# Patient Record
Sex: Male | Born: 1980 | Race: Black or African American | Hispanic: No | Marital: Single | Smoking: Never smoker
Health system: Southern US, Community
[De-identification: ages and names within clinical notes are randomized; demographics above are authoritative.]

## PROBLEM LIST (undated history)

## (undated) DIAGNOSIS — M5430 Sciatica, unspecified side: Secondary | ICD-10-CM

## (undated) DIAGNOSIS — F32A Depression, unspecified: Secondary | ICD-10-CM

## (undated) DIAGNOSIS — F329 Major depressive disorder, single episode, unspecified: Secondary | ICD-10-CM

## (undated) DIAGNOSIS — F419 Anxiety disorder, unspecified: Secondary | ICD-10-CM

## (undated) DIAGNOSIS — I10 Essential (primary) hypertension: Secondary | ICD-10-CM

## (undated) DIAGNOSIS — E119 Type 2 diabetes mellitus without complications: Secondary | ICD-10-CM

## (undated) HISTORY — PX: NO PAST SURGERIES: SHX2092

---

## 2015-02-05 ENCOUNTER — Encounter (HOSPITAL_COMMUNITY): Payer: Self-pay | Admitting: *Deleted

## 2015-02-05 ENCOUNTER — Emergency Department (HOSPITAL_COMMUNITY): Payer: Self-pay

## 2015-02-05 ENCOUNTER — Emergency Department (HOSPITAL_COMMUNITY)
Admission: EM | Admit: 2015-02-05 | Discharge: 2015-02-05 | Disposition: A | Discharge status: Home / Self Care | Payer: Self-pay | Attending: Emergency Medicine | Admitting: Emergency Medicine

## 2015-02-05 DIAGNOSIS — M545 Low back pain: Secondary | ICD-10-CM | POA: Insufficient documentation

## 2015-02-05 DIAGNOSIS — G8929 Other chronic pain: Secondary | ICD-10-CM | POA: Insufficient documentation

## 2015-02-05 DIAGNOSIS — R1110 Vomiting, unspecified: Secondary | ICD-10-CM | POA: Insufficient documentation

## 2015-02-05 MED ORDER — DIAZEPAM 5 MG PO TABS
5.0000 mg | ORAL_TABLET | Freq: Once | ORAL | Status: AC
Start: 2015-02-05 — End: 2015-02-05
  Administered 2015-02-05: 5 mg via ORAL
  Filled 2015-02-05: qty 1

## 2015-02-05 MED ORDER — DIAZEPAM 5 MG PO TABS: 5.0000 mg | ORAL_TABLET | Freq: Two times a day (BID) | ORAL | Status: DC

## 2015-02-05 MED ORDER — OXYCODONE-ACETAMINOPHEN 5-325 MG PO TABS: 2.0000 | ORAL_TABLET | ORAL | Status: DC | PRN

## 2015-02-05 MED ORDER — OXYCODONE-ACETAMINOPHEN 5-325 MG PO TABS
1.0000 | ORAL_TABLET | Freq: Once | ORAL | Status: AC
  Administered 2015-02-05: 1 via ORAL
  Filled 2015-02-05: qty 1

## 2015-02-05 NOTE — ED Provider Notes (Signed)
CSN: [id number]     Arrival date & time 02/05/15  1543 History  By signing my name below, I, Gwenyth Oberatherine Macek, attest that this documentation has been prepared under the direction and in the presence of Melburn HakeNicole Ambrea Hegler, [place]-C.  Electronically Signed: Gwenyth Oberatherine Macek, ED Scribe. 02/05/2015. 5:28 PM.   Chief Complaint  Patient presents with  . Back Pain   The history is provided by the patient. No language interpreter was used.    HPI Comments: Andres Collins is a [age] y.o. male with a history of chronic back pain who presents to the Emergency Department complaining of an acute-on-chronic episode of constant, moderate, cramping lower back pain that radiates to his right hip and right leg and started 4 days ago. Pt states 1 episode of vomiting secondary to pain. He tried Vicodin with no relief. Pt has been previously diagnosed with a herniated disc. He had an MRI prior to diagnosis, but is not currently followed by any specialists. Pt is required to do a lot of heavy lifting during work, but denies new injuries. He denies a history of cancer, spinal manipulation and IV drug use. Pt also denies saddle paresthesias, bladder or bowel incontinence, hematuria and difficulty urinating.  History reviewed. No pertinent past medical history. History reviewed. No pertinent past surgical history. No family history on file. Social History  Substance Use Topics  . Smoking status: Never Smoker   . Smokeless tobacco: None  . Alcohol Use: Yes    Review of Systems  Genitourinary: Negative for hematuria and difficulty urinating.  Musculoskeletal: Positive for back pain.  Neurological: Negative for numbness.      Allergies  Review of patient's allergies indicates not on file.  Home Medications   Prior to Admission medications   Medication Sig Start Date End Date Taking? Authorizing Provider  diazepam (VALIUM) 5 MG tablet Take 1 tablet (5 mg total) by mouth 2 (two) times daily. 02/05/15   Barrett HenleNicole Elizabeth  Amadu Schlageter, PA-C  oxyCODONE-acetaminophen (PERCOCET/ROXICET) 5-325 MG tablet Take 2 tablets by mouth every 4 (four) hours as needed for severe pain. 02/05/15   Satira SarkNicole Elizabeth Suly Vukelich, PA-C   BP 132/98 mmHg  Pulse 102  Temp(Src) 98.3 F (36.8 C) (Oral)  Resp 18  SpO2 100% Physical Exam  Constitutional: He is oriented to person, place, and time. He appears well-developed and well-nourished. No distress.  HENT:  Head: Normocephalic and atraumatic.  Eyes: Conjunctivae and EOM are normal.  Neck: Neck supple. No tracheal deviation present.  Cardiovascular: Normal rate.   Pulmonary/Chest: Effort normal. No respiratory distress.  Musculoskeletal:  Midline lumbar and right paraspinal muscles TTP  No cervical or thoracic midline tenderness Decreased ROM of back due to pain 5/5 strength bilateral LE Sensation intact 2+ PT pulses Positive right straight leg raise Pt able to stand and ambulate with reported pain  Neurological: He is alert and oriented to person, place, and time. He has normal reflexes. Coordination normal.  Skin: Skin is warm and dry.  Psychiatric: He has a normal mood and affect. His behavior is normal.  Nursing note and vitals reviewed.   ED Course  Procedures  DIAGNOSTIC STUDIES: Oxygen Saturation is 100% on RA, normal by my interpretation.    COORDINATION OF CARE: 5:29 PM Discussed treatment plan with pt which includes an x-ray of his lumbar spine, pain management and a muscle relaxant. Pt agreed to plan.  Labs Review Labs Reviewed - No data to display  Imaging Review Dg Lumbar Spine Complete  02/05/2015  CLINICAL DATA:  Acute on chronic episode of constant, moderate, cramping low back pain radiating to the right hip and right leg. Pain started 4 days ago. Vomiting due to pain. Previous history of herniated disc. EXAM: LUMBAR SPINE - COMPLETE 4+ VIEW COMPARISON:  None. FINDINGS: Normal alignment of the lumbar spine. No vertebral compression deformities. Degenerative  changes at L5-S1 with narrowed disc space and endplate hypertrophic changes. No focal bone lesion or bone destruction. Normal alignment of the facet joints. IMPRESSION: Degenerative changes most prominent at L5-S1. No acute displaced fractures identified. Electronically Signed   By: Burman Nieves M.D.   On: 02/05/2015 18:35   I have personally reviewed and evaluated these images as part of my medical decision-making.  Filed Vitals:   02/05/15 1614  BP: 132/98  Pulse: 102  Temp: 98.3 F (36.8 C)  Resp: 18     MDM   Final diagnoses:  Low back pain, unspecified back pain laterality, with sciatica presence unspecified    Patient presents with lower back pain radiating down to right hip and buttocks. He reports pain is consistent with back pain he has had in the past. Denies any recent fall, trauma, injury but endorses doing heavy lifting at work. No back pain red flags. VSS. Exam revealed lumbar midline and right paraspinal muscles TTP, positive right straight leg raise, decreased range of motion of back due to pain, bilateral lower extremities otherwise neurovascularly intact, patient able to stand and ambulate. Patient given pain meds and muscle relaxant, reports mild relief. Lumbar spine x-ray revealed degenerative changes, no acute abnormalities. I suspect patient's pain is likely associated with chronic back pain associated with herniated disc. I do not suspect cauda equina/spinal cord compression at this time. Discussed results and plan for discharge with patient. Patient given resource guide for follow-up.  Evaluation does not show pathology requring ongoing emergent intervention or admission. Pt is hemodynamically stable and mentating appropriately. Discussed findings/results and plan with patient/guardian, who agrees with plan. All questions answered. Return precautions discussed and outpatient follow up given.   I personally performed the services described in this documentation,  which was scribed in my presence. The recorded information has been reviewed and is accurate.    Satira Sark [place], [place] 02/05/15 1859  Nelva Nay, MD 02/06/15 1257

## 2015-02-05 NOTE — Discharge Instructions (Signed)
Take your medications as prescribed as needed for pain relief. He may also apply ice to affected area for 15-20 minutes 3-4 times daily. Please follow up with a primary care provider from the Resource Guide provided below in 1 week. Please return to the Emergency Department if symptoms worsen or new onset of fever, numbness, tingling, groin anesthesia, loss of bowel or bladder, weakness.    Emergency Department Resource Guide 1) Find a Doctor and Pay Out of Pocket Although you won't have to find out who is covered by your insurance plan, it is a good idea to ask around and get recommendations. You will then need to call the office and see if the doctor you have chosen will accept you as a new patient and what types of options they offer for patients who are self-pay. Some doctors offer discounts or will set up payment plans for their patients who do not have insurance, but you will need to ask so you aren't surprised when you get to your appointment.  2) Contact Your Local Health Department Not all health departments have doctors that can see patients for sick visits, but many do, so it is worth a call to see if yours does. If you don't know where your local health department is, you can check in your phone book. The CDC also has a tool to help you locate your state's health department, and many state websites also have listings of all of their local health departments.  3) Find a Walk-in Clinic If your illness is not likely to be very severe or complicated, you may want to try a walk in clinic. These are popping up all over the country in pharmacies, drugstores, and shopping centers. They're usually staffed by nurse practitioners or physician assistants that have been trained to treat common illnesses and complaints. They're usually fairly quick and inexpensive. However, if you have serious medical issues or chronic medical problems, these are probably not your best option.  No Primary Care  Doctor: - Call Health Connect at  [phone] - they can help you locate a primary care doctor that  accepts your insurance, provides certain services, etc. - Physician Referral Service- [phone]  Chronic Pain Problems: Organization         Address  Phone   Notes  [facility]  [phone] Patients need to be referred by their primary care doctor.   Medication Assistance: Organization         Address  Phone   Notes  [facility] Medication [facility] [address] [place], [place] [address] [phone] --Must be a resident of [place] -- Must have NO insurance coverage whatsoever (no Medicaid/ Medicare, etc.) -- The pt. MUST have a primary care doctor that directs their care regularly and follows them in the community   MedAssist  [phone]   [employer]  [phone]) [phone]    Agencies that provide inexpensive medical care: Organization         Address  Phone   Notes  [facility]  [phone]   [facility]    [phone]) [phone]   [facility] [address] [place], [place] [address] [phone]   [facility] [address] [place], [place] [phone]) [phone]   Planned Parenthood    [phone]   [facility]    [phone](336) [phone]   [facility] and [facility]  [address], [place] Phone:  [phone], Fax:  [phone] Hours of Operation:  9 am - 6 pm, M-F.  Also accepts Medicaid/Medicare and self-pay.  [facility] for [place] [address], [address], [place] Phone: [phone], Fax: [phone]. Hours of Operation:  8:30 am - 5:30 pm, M-F.  Also accepts Medicaid and self-pay.  [facility] [place] [address], [place] Phone: [phone]   [place], [place], [place] [phone], Ext. [phone] Mondays & Thursdays: 7-9 AM.  First 15 patients are seen on a first  come, first serve basis.    [place] Providers:  Organization         Address  Phone   Notes  [facility] [address], [place] [phone] Also accepts self-pay patients.  [facility] [address], [place]  [phone]   [place], [address], [place] [phone]   [facility] [address], [place] [phone]   Lucianne Lei [address], [place]   [phone] Only accepts [place] Access [place] patients after they have their name applied to their card.   Self-Pay (no insurance) in [facility]:  Organization         Address  Phone   Notes  Sickle Cell Patients, [facility] [place] [phone]   [facility] [place] [phone]   [facility] [place]  [place], [place], [place] [phone]   [facility]/Dr. Osei-Bonsu  [address], [place] or [address], [place] [phone] Phone number for both [place] and [place] locations is the same.  [facility]gent [facility] and [facility] [address], [place] [phone]   [facility] [address], [place] or [address] [phone]   [facility] [address], [place] [phone], phone; [phone], fax Sees patients 1st and 3rd Saturday of every month.  Must not qualify for public or private insurance (i.e. Medicaid, Medicare, [place] Health Choice, Veterans' Benefits)  Household income should be no more than 200% of the poverty level The clinic cannot treat you if you are pregnant or think you are pregnant  Sexually transmitted diseases are not treated at the clinic.    Dental Care: Organization          Address  Phone  Notes  [facility] of [facility] [place] [phone] Accepts children up to age 13 who are enrolled in [place] or [place]; pregnant women with a Medicaid card; and children who have applied for Medicaid or [place] Health Choice, but were declined, whose parents can pay a reduced fee at time of service.  [facility] Department of [facility]  [address], [place] [phone] Accepts children up to age 8 who are enrolled in [place] or [place]; pregnant women with a Medicaid card; and children who have applied for Medicaid or [place] Health Choice, but were declined, whose parents can pay a reduced fee at time of service.  [facility] Adult Dental Access PROGRAM  [place] [phone] Patients are  seen by appointment only. Walk-ins are not accepted. [place] will see patients 20 years of age and older. Monday - Tuesday (8am-5pm) Most Wednesdays (8:30-5pm) $30 per visit, cash only  [facility] Adult Dental Access PROGRAM  [address], [facility] [phone] Patients are seen by appointment only. Walk-ins are not accepted. [place] will see patients 57 years of age and older. One Wednesday Evening (Monthly: Volunteer Based).  $30 per visit, cash only  [place]  [phone] for adults; Children under age 71, call Graduate Pediatric Dentistry at [phone]. Children aged 38-14, please call [phone] to request a pediatric application.  Dental services are provided in all areas of dental care including fillings, crowns and bridges, complete and partial dentures, implants, gum treatment, root canals, and extractions. Preventive care is also provided. Treatment is provided to both adults and children. Patients are selected via a lottery and there is often a waiting list.   [facility] [address], [place]  [phone] [email or web address]   [facility] [address] [place], [place] [phone], Ext. [phone] Second and Fourth Thursday of each month, opens at 6:30 AM; Clinic ends at 9 AM.  Patients are seen on a first-come first-served basis, and a limited number are seen during each clinic.   [facility]  [address] Hillard Danker [place], [place] [phone]   Eligibility Requirements You must have lived in [place], [place], or [place] counties for at least the last three months.   You cannot be eligible for state or federal sponsored [employer], including [employer], [place], or [employer].   You generally cannot be eligible for healthcare insurance through your employer.    How to apply: Eligibility screenings are held every Tuesday and Wednesday afternoon from 1:00 pm until 4:00 pm. You do not need an appointment for the interview!  [facility] [address] [place], [place]   [place]  [place] Department  [place]  [phone]    Behavioral Health Resources in the Community: Intensive Outpatient Programs Organization         Address  Phone  Notes  [place]. [address] [place], [place] [phone]   [facility] [address], [place], [place]   ADS: Alcohol & Drug Svcs [address] [place], [place]   [place] [address]  [place], [place] or [phone]   Substance Abuse Resources Organization         Address  Phone  Notes  Alcohol and Drug Services  [phone]   [place]  [phone]   The [place]   Chinita Pester  [phone]   Residential & Outpatient Substance Abuse Program  [phone]   Psychological  Services Organization         Address  Phone  Notes  [facility] [place]  [place]  [phone]   [place] [address] [place] or [phone]    Mobile Crisis Teams Organization         Address  Phone  Notes  [facility]rapeutic Alternatives, Mobile Crisis Care Unit  [phone]   Assertive Psychotherapeutic Services  [address] [place], [place]   [facility] [address] [place]  [place] [phone]    Self-Help/Support Groups Organization         Address  Phone             Notes  [facility] [place] - variety of support groups  [place] Call for more information  Narcotics Anonymous (NA), [facility]aring [facility] [address], [employer] [place]  2 meetings at this location   Special educational needs teacher         Address  Phone  Notes  ASAP Residential Treatment [place],    [place]  [phone]   [facility]  [address] [place] [phone], [place], [place]   [place], [place] [phone] Admissions: 8am-3pm M-F  Incentives Substance [place] [address],    [place], [place] [phone]   [facility] [address] [place], [place], [place]   [facility] [address]  [place], [place]   [facility] Programs - Intensive Outpatient [address], [place], [place]   [facility] ([place].,  [place], [place] [phone] or [phone]   [facility] Services (RTS) [address] [place], [place] Accepts Medicaid  [facility] [place] [address]  [place] [phone] Substance Abuse/Addiction Treatment   [facility] Organization         Address  Phone  Notes  [facility]  [phone]   Domenic Schwab, PhD [address] Arlis Port[place], [place]   [phone] or [phone]   [place] Ber[place]ley [place], [place] [phone]   [facility] [address] [place], [place] [phone] Insurance/Medicaid/sponsorship through [facility] and Families [address] [place]                                    [place], [place] [phone] [place] [address], [place] [phone]    Dr. Adele Schilder  [phone]   [facility] Dept. 1) [address] [place] 2) [place] 3)  [place] [address], [place] [phone]  [phone]   [place] [phone] or [phone] (After Hours)

## 2015-02-05 NOTE — ED Notes (Signed)
Pt with hx herniated disc complains of lower back pain since Friday. 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 Pt states he was unable to work yesterday or today due to pain.

## 2015-10-17 ENCOUNTER — Encounter (HOSPITAL_COMMUNITY): Payer: Self-pay | Admitting: Emergency Medicine

## 2015-10-17 ENCOUNTER — Emergency Department (HOSPITAL_COMMUNITY)
Admission: EM | Admit: 2015-10-17 | Discharge: 2015-10-17 | Disposition: A | Discharge status: Home / Self Care | Payer: Medicaid Other | Attending: Emergency Medicine | Admitting: Emergency Medicine

## 2015-10-17 DIAGNOSIS — E119 Type 2 diabetes mellitus without complications: Secondary | ICD-10-CM | POA: Diagnosis not present

## 2015-10-17 DIAGNOSIS — M5431 Sciatica, right side: Secondary | ICD-10-CM

## 2015-10-17 DIAGNOSIS — Z79899 Other long term (current) drug therapy: Secondary | ICD-10-CM | POA: Diagnosis not present

## 2015-10-17 DIAGNOSIS — M5441 Lumbago with sciatica, right side: Secondary | ICD-10-CM | POA: Diagnosis not present

## 2015-10-17 DIAGNOSIS — Z791 Long term (current) use of non-steroidal anti-inflammatories (NSAID): Secondary | ICD-10-CM | POA: Insufficient documentation

## 2015-10-17 DIAGNOSIS — M545 Low back pain: Secondary | ICD-10-CM | POA: Diagnosis present

## 2015-10-17 HISTORY — DX: Type 2 diabetes mellitus without complications: E11.9

## 2015-10-17 MED ORDER — DIAZEPAM 5 MG/ML IJ SOLN
5.0000 mg | Freq: Once | INTRAMUSCULAR | Status: AC
  Administered 2015-10-17: 5 mg via INTRAMUSCULAR
  Filled 2015-10-17: qty 2

## 2015-10-17 MED ORDER — KETOROLAC TROMETHAMINE 30 MG/ML IJ SOLN
30.0000 mg | Freq: Once | INTRAMUSCULAR | Status: AC
  Administered 2015-10-17: 30 mg via INTRAMUSCULAR
  Filled 2015-10-17: qty 1

## 2015-10-17 MED ORDER — PREDNISONE 20 MG PO TABS: 40.0000 mg | ORAL_TABLET | Freq: Every day | ORAL | 0 refills | Status: AC

## 2015-10-17 MED ORDER — METHOCARBAMOL 500 MG PO TABS: 500.0000 mg | ORAL_TABLET | Freq: Two times a day (BID) | ORAL | 0 refills | Status: AC

## 2015-10-17 NOTE — ED Provider Notes (Signed)
[facility]-EMERGENCY DEPT Provider Note   CSN: [id number] Arrival date & time: 10/17/15  1114     History   Chief Complaint Chief Complaint  Patient presents with  . Back Pain    HPI Andres Collins is a 35 y.o. male.  Patient presents to the emergency department with chief complaint of low back pain. He reports prior history of herniated disks. He states that he is fairly new to the area, and does not have a new doctor here. He states that his back pain flares up occasionally, normally he has to takes muscle relaxers and naproxen and rest and it resolves on sun. He states that his current episode has not been resolving. He states that the symptoms have been ongoing for about a week now. He reports pain that radiates down his right leg. He denies bowel or bladder incontinence. He is able to ambulate. He denies any numbness. Denies any fevers chills. Denies history of cancer. Denies IV drug use.   The history is provided by the patient. No language interpreter was used.    Past Medical History:  Diagnosis Date  . Diabetes mellitus without complication (HCC)     There are no active problems to display for this patient.   History reviewed. No pertinent surgical history.     Home Medications    Prior to Admission medications   Medication Sig Start Date End Date Taking? Authorizing Provider  diazepam (VALIUM) 5 MG tablet Take 1 tablet (5 mg total) by mouth 2 (two) times daily. 02/05/15   Barrett Henle, PA-C  oxyCODONE-acetaminophen (PERCOCET/ROXICET) 5-325 MG tablet Take 2 tablets by mouth every 4 (four) hours as needed for severe pain. 02/05/15   Barrett Henle, PA-C    Family History No family history on file.  Social History Social History  Substance Use Topics  . Smoking status: Never Smoker  . Smokeless tobacco: Never Used  . Alcohol use Yes     Comment: social      Allergies   Review of patient's allergies indicates no known  allergies.   Review of Systems Review of Systems  Constitutional: Negative for chills and fever.  Gastrointestinal:       No bowel incontinence  Genitourinary:       No urinary incontinence  Musculoskeletal: Positive for arthralgias, back pain and myalgias.  Neurological:       No saddle anesthesia     Physical Exam Updated Vital Signs BP (!) 155/115   Pulse 120   Temp 98.1 F (36.7 C)   Resp 24   SpO2 100%   Physical Exam Physical Exam  Constitutional: Pt appears uncomfortable, well-developed and well-nourished.  HENT:  Head: Normocephalic and atraumatic.  Mouth/Throat: Oropharynx is clear and moist. No oropharyngeal exudate.  Eyes: Conjunctivae are normal.  Neck: Normal range of motion. Neck supple.  No meningismus Cardiovascular: Normal rate, regular rhythm and intact distal pulses.   Pulmonary/Chest: Effort normal and breath sounds normal. No respiratory distress. Pt has no wheezes.  Abdominal: Pt exhibits no distension Musculoskeletal:  Lumbar paraspinal muscles tender to palpation, no bony CTLS spine tenderness, deformity, step-off, or crepitus Lymphadenopathy: Pt has no cervical adenopathy.  Neurological: Pt is alert and oriented Speech is clear and goal oriented, follows commands Normal 5/5 strength in upper and lower extremities bilaterally including dorsiflexion and plantar flexion, strong and equal grip strength Sensation intact Great toe extension intact Moves extremities without ataxia, coordination intact Ankle and knee jerk reflexes intact and symmetrical  Normal gait Normal balance No Clonus Skin: Skin is warm and dry. No rash noted. Pt is not diaphoretic. No erythema.  Psychiatric: Pt has a normal mood and affect. Behavior is normal.  Nursing note and vitals reviewed.   ED Treatments / Results  Labs (all labs ordered are listed, but only abnormal results are displayed) Labs Reviewed - No data to display  EKG  EKG Interpretation None        Radiology No results found.  Procedures Procedures (including critical care time)  Medications Ordered in ED Medications  ketorolac (TORADOL) 30 MG/ML injection 30 mg (not administered)  diazepam (VALIUM) injection 5 mg (not administered)     Initial Impression / Assessment and Plan / ED Course  I have reviewed the triage vital signs and the nursing notes.  Pertinent labs & imaging results that were available during my care of the patient were reviewed by me and considered in my medical decision making (see chart for details).  Clinical Course    Patient with back pain.  No neurological deficits and normal neuro exam.  Patient is ambulatory.  No loss of bowel or bladder control.  Doubt cauda equina.  Denies fever,  doubt epidural abscess or other lesion. Recommend back exercises, stretching, RICE, and will treat with a short course of prednisone and robaxin.  Encouraged the patient that there could be a need for additional workup and/or imaging such as MRI, if the symptoms do not resolve. Patient advised that if the back pain does not resolve, or radiates, this could progress to more serious conditions and is encouraged to follow-up with PCP or orthopedics within 2 weeks.     Final Clinical Impressions(s) / ED Diagnoses   Final diagnoses:  Sciatica of right side    New Prescriptions New Prescriptions   METHOCARBAMOL (ROBAXIN) 500 MG TABLET    Take 1 tablet (500 mg total) by mouth 2 (two) times daily.   PREDNISONE (DELTASONE) 20 MG TABLET    Take 2 tablets (40 mg total) by mouth daily.     Roxy Horsemanobert Rembert Browe, PA-C 10/17/15 1226    Shaune Pollackameron Isaacs, MD 10/18/15 [id number]

## 2015-10-17 NOTE — ED Triage Notes (Signed)
Patient states that has herniated disc in his back and two weeks ago was moving and then went to work last Monday and back was hurting so bad that he had to leave. Patient states that he has tried pain meds, heat, ice and nothing is helping it get better this time.

## 2015-10-17 NOTE — ED Notes (Signed)
Verbalized understanding discharge instructions, prescriptions, and follow-up. In no acute distress.  Pt given a ice pack.

## 2015-10-17 NOTE — Progress Notes (Signed)
[place] uninsured pt Referral to [facility] staff

## 2015-10-28 ENCOUNTER — Emergency Department (HOSPITAL_COMMUNITY)
Admission: EM | Admit: 2015-10-28 | Discharge: 2015-10-28 | Disposition: A | Discharge status: Home / Self Care | Payer: Medicaid Other | Attending: Emergency Medicine | Admitting: Emergency Medicine

## 2015-10-28 ENCOUNTER — Encounter (HOSPITAL_COMMUNITY): Payer: Self-pay | Admitting: Emergency Medicine

## 2015-10-28 DIAGNOSIS — M545 Low back pain: Secondary | ICD-10-CM | POA: Diagnosis present

## 2015-10-28 DIAGNOSIS — G8929 Other chronic pain: Secondary | ICD-10-CM

## 2015-10-28 DIAGNOSIS — M62830 Muscle spasm of back: Secondary | ICD-10-CM

## 2015-10-28 DIAGNOSIS — R202 Paresthesia of skin: Secondary | ICD-10-CM | POA: Insufficient documentation

## 2015-10-28 DIAGNOSIS — E119 Type 2 diabetes mellitus without complications: Secondary | ICD-10-CM | POA: Insufficient documentation

## 2015-10-28 DIAGNOSIS — M549 Dorsalgia, unspecified: Secondary | ICD-10-CM

## 2015-10-28 DIAGNOSIS — R200 Anesthesia of skin: Secondary | ICD-10-CM | POA: Diagnosis not present

## 2015-10-28 DIAGNOSIS — Z79899 Other long term (current) drug therapy: Secondary | ICD-10-CM | POA: Diagnosis not present

## 2015-10-28 DIAGNOSIS — M5441 Lumbago with sciatica, right side: Secondary | ICD-10-CM | POA: Diagnosis not present

## 2015-10-28 DIAGNOSIS — M5431 Sciatica, right side: Secondary | ICD-10-CM

## 2015-10-28 HISTORY — DX: Sciatica, unspecified side: M54.30

## 2015-10-28 MED ORDER — METHOCARBAMOL 500 MG PO TABS
1000.0000 mg | ORAL_TABLET | Freq: Once | ORAL | Status: AC
  Administered 2015-10-28: 1000 mg via ORAL
  Filled 2015-10-28: qty 2

## 2015-10-28 MED ORDER — OXYCODONE-ACETAMINOPHEN 5-325 MG PO TABS: 1.0000 | ORAL_TABLET | Freq: Four times a day (QID) | ORAL | 0 refills | Status: DC | PRN

## 2015-10-28 MED ORDER — METHOCARBAMOL 500 MG PO TABS: 500.0000 mg | ORAL_TABLET | Freq: Three times a day (TID) | ORAL | 0 refills | Status: AC | PRN

## 2015-10-28 MED ORDER — OXYCODONE-ACETAMINOPHEN 5-325 MG PO TABS
1.0000 | ORAL_TABLET | Freq: Once | ORAL | Status: AC
  Administered 2015-10-28: 1 via ORAL
  Filled 2015-10-28: qty 1

## 2015-10-28 MED ORDER — NAPROXEN 500 MG PO TABS: 500.0000 mg | ORAL_TABLET | Freq: Two times a day (BID) | ORAL | 0 refills | Status: AC | PRN

## 2015-10-28 NOTE — ED Triage Notes (Signed)
Pt reports acute chronic lower back pain worsening over past four weeks. Pt reports out of pain medication for two days.

## 2015-10-28 NOTE — ED Provider Notes (Signed)
[facility] Provider Note   CSN: [id number] Arrival date & time: 10/28/15  1016     History   Chief Complaint Chief Complaint  Patient presents with  . Back Pain    HPI Andres Collins is a 35 y.o. male with a PMHx of DM2 and sciatica/chronic back pain, who presents to the ED with complaints of 4 weeks of acute on chronic back pain. Has a history of sciatica and bulging disks in his lumbar spine, states that 4 weeks ago he was doing some heavy lifting at work and moving furniture, and this seemed to aggravate his symptoms. He was seen on 10/17/15 in the ER, given Toradol and Valium IM which he states did not help, discharged home with prednisone and Robaxin to reports has not helped. He has taken approximately past with some relief. He describes the pain as 10/10 aching throbbing constant right low back pain radiating into the right leg, worse with "everything", and unrelieved with ice, heat, prednisone, Robaxin, and muscle rub. He made an appointment with his spine specialist at Dr. Fredrich BirksStern's office for 11/04/15, but comes in today because he states he needs something to get him to that appointment. He has been unable to work since the pain started.  He denies any fevers, chills, chest pain, shortness breath, abdominal pain, nausea, vomiting, diarrhea, constipation, dysuria, hematuria, incontinence of urine or stool, saddle anesthesia or cauda equina symptoms, or focal weakness. Denies any history of cancer or IV drug use.   The history is provided by the patient and medical records. No language interpreter was used.  Back Pain   This is a recurrent problem. The current episode started more than 1 week ago. The problem occurs constantly. The problem has been gradually worsening. The pain is associated with lifting heavy objects. The pain is present in the lumbar spine and gluteal region. The quality of the pain is described as aching. The pain radiates to the right thigh. The pain is at a  severity of 10/10. The pain is severe. Exacerbated by: "everything" The pain is the same all the time. Associated symptoms include numbness (tingling in R leg), paresthesias and tingling. Pertinent negatives include no chest pain, no fever, no abdominal pain, no bowel incontinence, no bladder incontinence, no dysuria and no weakness. He has tried muscle relaxants (and prednisone) for the symptoms. The treatment provided no relief.    Past Medical History:  Diagnosis Date  . Diabetes mellitus without complication (HCC)   . Sciatica     There are no active problems to display for this patient.   History reviewed. No pertinent surgical history.     Home Medications    Prior to Admission medications   Medication Sig Start Date End Date Taking? Authorizing Provider  methocarbamol (ROBAXIN) 500 MG tablet Take 1 tablet (500 mg total) by mouth 2 (two) times daily. 10/17/15   Roxy Horsemanobert Browning, PA-C  methocarbamol (ROBAXIN) 500 MG tablet Take 500 mg by mouth every 6 (six) hours as needed for muscle spasms.    Historical Provider, MD  naproxen (NAPROSYN) 500 MG tablet Take 500 mg by mouth 2 (two) times daily with a meal.    Historical Provider, MD  predniSONE (DELTASONE) 20 MG tablet Take 2 tablets (40 mg total) by mouth daily. 10/17/15   Roxy Horsemanobert Browning, PA-C    Family History No family history on file.  Social History Social History  Substance Use Topics  . Smoking status: Never Smoker  . Smokeless tobacco: Never  Used  . Alcohol use Yes     Comment: social      Allergies   Review of patient's allergies indicates no known allergies.   Review of Systems Review of Systems  Constitutional: Negative for chills and fever.  Respiratory: Negative for shortness of breath.   Cardiovascular: Negative for chest pain.  Gastrointestinal: Negative for abdominal pain, bowel incontinence, constipation, diarrhea, nausea and vomiting.  Genitourinary: Negative for bladder incontinence, difficulty  urinating (no incontinence), dysuria and hematuria.  Musculoskeletal: Positive for back pain. Negative for arthralgias and myalgias.  Skin: Negative for color change.  Allergic/Immunologic: Negative for immunocompromised state.  Neurological: Positive for tingling, numbness (tingling in R leg) and paresthesias. Negative for weakness.  Psychiatric/Behavioral: Negative for confusion.   10 Systems reviewed and are negative for acute change except as noted in the HPI.   Physical Exam Updated Vital Signs BP 151/77 (BP Location: Right Arm)   Pulse 97   Temp 98.4 F (36.9 C) (Oral)   Resp 18   Ht 6\' 4"  (1.93 m)   Wt 113.4 kg   SpO2 95%   BMI 30.43 kg/m    Physical Exam  Constitutional: He is oriented to person, place, and time. Vital signs are normal. He appears well-developed and well-nourished.  Non-toxic appearance. No distress.  Afebrile, nontoxic, NAD  HENT:  Head: Normocephalic and atraumatic.  Mouth/Throat: Mucous membranes are normal.  Eyes: Conjunctivae and EOM are normal. Right eye exhibits no discharge. Left eye exhibits no discharge.  Neck: Normal range of motion. Neck supple.  Cardiovascular: Normal rate and intact distal pulses.   Pulmonary/Chest: Effort normal. No respiratory distress.  Abdominal: Normal appearance. He exhibits no distension.  Musculoskeletal: Normal range of motion.       Lumbar back: He exhibits tenderness and spasm. He exhibits normal range of motion, no bony tenderness and no deformity.       Back:  Lumbar spine with FROM intact without spinous process TTP, no bony stepoffs or deformities, with mild R sided paraspinous and piriformis muscle TTP and muscle spasms. Strength and sensation grossly intact in all extremities, +SLR in R side, neg SLR on L side, gait steady. No overlying skin changes. Distal pulses intact.   Neurological: He is alert and oriented to person, place, and time. He has normal strength. No sensory deficit.  Skin: Skin is warm,  dry and intact. No rash noted.  Psychiatric: He has a normal mood and affect.  Nursing note and vitals reviewed.    ED Treatments / Results  Labs (all labs ordered are listed, but only abnormal results are displayed) Labs Reviewed - No data to display  EKG  EKG Interpretation None       Radiology No results found.  Lumbar Xray 01/2015: Study Result  CLINICAL DATA:  Acute on chronic episode of constant, moderate, cramping low back pain radiating to the right hip and right leg. Pain started 4 days ago. Vomiting due to pain. Previous history of herniated disc.  EXAM: LUMBAR SPINE - COMPLETE 4+ VIEW  COMPARISON:  None.  FINDINGS: Normal alignment of the lumbar spine. No vertebral compression deformities. Degenerative changes at L5-S1 with narrowed disc space and endplate hypertrophic changes. No focal bone lesion or bone destruction. Normal alignment of the facet joints.  IMPRESSION: Degenerative changes most prominent at L5-S1. No acute displaced fractures identified.   Electronically Signed   By: Burman Nieves M.D.   On: 02/05/2015 18:35     Procedures Procedures (including critical care  time)  Medications Ordered in ED Medications  oxyCODONE-acetaminophen (PERCOCET/ROXICET) 5-325 MG per tablet 1 tablet (1 tablet Oral Given 10/28/15 1239)  methocarbamol (ROBAXIN) tablet 1,000 mg (1,000 mg Oral Given 10/28/15 1239)     Initial Impression / Assessment and Plan / ED Course  I have reviewed the triage vital signs and the nursing notes.  Pertinent labs & imaging results that were available during my care of the patient were reviewed by me and considered in my medical decision making (see chart for details).  Clinical Course    35 y.o. male here with acute on chronic low back pain radiating into R leg. No red flag s/s of low back pain. No s/s of central cord compression or cauda equina. Lower extremities are neurovascularly intact and patient is  ambulating without issue. Prior xray from Dec 2016 shows degenerative changes in lumbar spine, known disk issues already. Exam consistent with sciatica. No midline tenderness, +SLR on R side, +paraspinous muscle tenderness and spasm. Reviewed NCDB, no narcotics or controlled substances since Dec 2016, I feel it's reasonable to give a short course of percocet with naprosyn and robaxin refill, but discussed that this will be a one-time courtesy and he needs to f/up with his back specialist for chronic pain management.   Patient was counseled on back pain precautions and told to do activity as tolerated but do not lift, push, or pull heavy objects more than 10 pounds for the next week. Patient counseled to use ice or heat on back for no longer than 15 minutes every hour.   Rx given for muscle relaxer and counseled on proper use of muscle relaxant medication. Rx given for narcotic pain medicine and counseled on proper use of narcotic pain medications. Told that they can increase to every 4 hrs if needed while pain is worse. Counseled not to combine this medication with others containing tylenol. Urged patient not to drink alcohol, drive, or perform any other activities that requires focus while taking either of these medications.   Patient urged to follow-up with his spine specialist for ongoing management of his chronic pain, or if pain does not improve with treatment and rest or if pain becomes recurrent. Urged to return with worsening severe pain, loss of bowel or bladder control, trouble walking. The patient verbalizes understanding and agrees with the plan.   Final Clinical Impressions(s) / ED Diagnoses   Final diagnoses:  Chronic back pain  Muscle spasm of back  Sciatica of right side    New Prescriptions New Prescriptions   METHOCARBAMOL (ROBAXIN) 500 MG TABLET    Take 1 tablet (500 mg total) by mouth every 8 (eight) hours as needed for muscle spasms.   NAPROXEN (NAPROSYN) 500 MG TABLET    Take  1 tablet (500 mg total) by mouth 2 (two) times daily as needed for mild pain, moderate pain or headache (TAKE WITH MEALS.).   OXYCODONE-ACETAMINOPHEN (PERCOCET) 5-325 MG TABLET    Take 1 tablet by mouth every 6 (six) hours as needed for severe pain.     Dailon Sheeran Camprubi-Soms, PA-C 10/28/15 1253    Jerelyn Scott, MD 10/28/15 1259

## 2015-10-28 NOTE — Discharge Instructions (Signed)
Back Pain: Your back pain should be treated with medicines such as ibuprofen or aleve and this back pain should get better over the next 2 weeks.  However if you develop severe or worsening pain, low back pain with fever, numbness, weakness or inability to walk or urinate, you should return to the ER immediately.  Please follow up with your doctor this week for a recheck if still having symptoms.  Avoid heavy lifting over 10 pounds over the next two weeks.  Low back pain is discomfort in the lower back that may be due to injuries to muscles and ligaments around the spine.  Occasionally, it may be caused by a a problem to a part of the spine called a disc.  The pain may last several days or a week;  However, most patients get completely well in 4 weeks.  Self - care:  The application of heat can help soothe the pain.  Maintaining your daily activities, including walking, is encourged, as it will help you get better faster than just staying in bed. Perform gentle stretching as discussed. Drink plenty of fluids.  Medications are also useful to help with pain control.  A commonly prescribed medication includes percocet.  Do not drive or operate heavy machinery while taking this medication.  Non steroidal anti inflammatory medications including Ibuprofen and naproxen;  These medications help both pain and swelling and are very useful in treating back pain.  They should be taken with food, as they can cause stomach upset, and more seriously, stomach bleeding.    Muscle relaxants:  These medications can help with muscle tightness that is a cause of lower back pain.  Most of these medications can cause drowsiness, and it is not safe to drive or use dangerous machinery while taking them.  SEEK IMMEDIATE MEDICAL ATTENTION IF: New numbness, tingling, weakness, or problem with the use of your arms or legs.  Severe back pain not relieved with medications.  Difficulty with or loss of control of your bowel or  bladder control.  Increasing pain in any areas of the body (such as chest or abdominal pain).  Shortness of breath, dizziness or fainting.  Nausea (feeling sick to your stomach), vomiting, fever, or sweats.  You will need to follow up with  Your spine specialist in 1-2 weeks for reassessment.

## 2015-11-05 ENCOUNTER — Other Ambulatory Visit: Payer: Self-pay | Admitting: Neurosurgery

## 2015-11-05 DIAGNOSIS — M5441 Lumbago with sciatica, right side: Secondary | ICD-10-CM

## 2015-11-27 ENCOUNTER — Other Ambulatory Visit: Payer: Self-pay | Admitting: Neurosurgery

## 2015-11-27 DIAGNOSIS — M5441 Lumbago with sciatica, right side: Principal | ICD-10-CM

## 2015-11-27 DIAGNOSIS — G8929 Other chronic pain: Secondary | ICD-10-CM

## 2015-12-02 ENCOUNTER — Other Ambulatory Visit: Payer: Self-pay

## 2015-12-04 ENCOUNTER — Ambulatory Visit
Admission: RE | Admit: 2015-12-04 | Discharge: 2015-12-04 | Disposition: A | Discharge status: Home / Self Care | Payer: Medicaid Other | Source: Ambulatory Visit | Attending: Neurosurgery | Admitting: Neurosurgery

## 2015-12-04 DIAGNOSIS — M5441 Lumbago with sciatica, right side: Principal | ICD-10-CM

## 2015-12-04 DIAGNOSIS — G8929 Other chronic pain: Secondary | ICD-10-CM

## 2015-12-04 MED ORDER — METHYLPREDNISOLONE ACETATE 40 MG/ML INJ SUSP (RADIOLOG
120.0000 mg | Freq: Once | INTRAMUSCULAR | Status: AC
  Administered 2015-12-04: 120 mg via EPIDURAL

## 2015-12-04 MED ORDER — IOPAMIDOL (ISOVUE-M 200) INJECTION 41%
1.0000 mL | Freq: Once | INTRAMUSCULAR | Status: AC
  Administered 2015-12-04: 1 mL via EPIDURAL

## 2015-12-04 NOTE — Discharge Instructions (Signed)

## 2015-12-09 ENCOUNTER — Other Ambulatory Visit: Payer: Self-pay

## 2015-12-31 ENCOUNTER — Other Ambulatory Visit: Payer: Self-pay | Admitting: Neurosurgery

## 2015-12-31 DIAGNOSIS — M5441 Lumbago with sciatica, right side: Secondary | ICD-10-CM

## 2016-01-02 ENCOUNTER — Other Ambulatory Visit: Payer: Self-pay | Admitting: Neurosurgery

## 2016-01-09 ENCOUNTER — Other Ambulatory Visit: Payer: Self-pay

## 2016-01-09 ENCOUNTER — Encounter (HOSPITAL_COMMUNITY)
Admission: RE | Admit: 2016-01-09 | Discharge: 2016-01-09 | Disposition: A | Discharge status: Home / Self Care | Payer: Medicaid Other | Source: Ambulatory Visit | Attending: Neurosurgery | Admitting: Neurosurgery

## 2016-01-09 ENCOUNTER — Encounter (HOSPITAL_COMMUNITY): Payer: Self-pay | Admitting: Anesthesiology

## 2016-01-09 ENCOUNTER — Encounter (HOSPITAL_COMMUNITY): Payer: Self-pay

## 2016-01-09 DIAGNOSIS — Z01812 Encounter for preprocedural laboratory examination: Secondary | ICD-10-CM | POA: Insufficient documentation

## 2016-01-09 HISTORY — DX: Major depressive disorder, single episode, unspecified: F32.9

## 2016-01-09 HISTORY — DX: Anxiety disorder, unspecified: F41.9

## 2016-01-09 HISTORY — DX: Essential (primary) hypertension: I10

## 2016-01-09 HISTORY — DX: Depression, unspecified: F32.A

## 2016-01-09 LAB — BASIC METABOLIC PANEL
ANION GAP: 7 (ref 5–15)
BUN: 11 mg/dL (ref 6–20)
CHLORIDE: 104 mmol/L (ref 101–111)
CO2: 27 mmol/L (ref 22–32)
Calcium: 9.6 mg/dL (ref 8.9–10.3)
Creatinine, Ser: 1.01 mg/dL (ref 0.61–1.24)
GFR calc non Af Amer: >60 mL/min (ref >60)
Glucose, Bld: 180 mg/dL — ABNORMAL HIGH (ref 65–99)
POTASSIUM: 4.1 mmol/L (ref 3.5–5.1)
Sodium: 138 mmol/L (ref 135–145)

## 2016-01-09 LAB — GLUCOSE, CAPILLARY: GLUCOSE-CAPILLARY: 174 mg/dL — AB (ref 65–99)

## 2016-01-09 LAB — SURGICAL PCR SCREEN
MRSA, PCR: NEGATIVE
Staphylococcus aureus: NEGATIVE

## 2016-01-09 LAB — CBC
HEMATOCRIT: 40.5 % (ref 39.0–52.0)
HEMOGLOBIN: 14.1 g/dL (ref 13.0–17.0)
MCH: 33.3 pg (ref 26.0–34.0)
MCHC: 34.8 g/dL (ref 30.0–36.0)
MCV: 95.7 fL (ref 78.0–100.0)
Platelets: 199 10*3/uL (ref 150–400)
RBC: 4.23 MIL/uL (ref 4.22–5.81)
RDW: 12.4 % (ref 11.5–15.5)
WBC: 9.1 10*3/uL (ref 4.0–10.5)

## 2016-01-09 NOTE — Progress Notes (Signed)
Anesthesia Chart Review: Patient is a 35 year old male scheduled for right L4-5 microdiscectomy on 01/10/16 by Dr. Conchita ParisNundkumar.  History includes non-smoker, DM2 (diet controlled), HTN (no meds), sciatica, anxiety, depression. BMI is consistent with mild obesity. He is not currently being followed regularly by a PCP, but has been [facility] in [facility] past.   Meds include gabapentin, Robaxin.  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 (There is come artifact, particularly in I, aVL, and III.  He could not lie down for the EKG, so it was done sitting.) Currently there is no comparison tracing in [facility] or in [facility]. He denied CP/SOB at PAT.   Preoperative labs noted. A1c is pending--won't result until 01/10/16. He will get a fasting CBG on arrival tomorrow.  If no acute changes then I anticipate that he can proceed as planned.  Velna Ochsllison Zelenak, PA-C [facility]/Anesthesiology Phone [phone] 01/09/2016 2:34 PM

## 2016-01-09 NOTE — Pre-Procedure Instructions (Signed)
    Andres Collins  01/09/2016    Your procedure is scheduled on Friday, November 17.  [facility] Ad[facility] at 7:45 AM                Your surgery or procedure is scheduled for 9:454 AM   Call this number if you have problems the morning of surgery: [phone]    Remember:  Do not eat food or drink liquids after midnight.  Take these medicines the morning of surgery with A SIP OF WATER: gabapentin (NEURONTIN), methocarbamol (ROBAXIN).                Stop taking Aspirin, Aspirin Products, Vitamins and herbal medications.  Do not take any NSAIDS ie:  Ibuprofen, Advil, Naproxen.   Do not wear jewelry, make-up or nail polish.  Do not wear lotions, powders, or perfumes, or deodorant.   Men may shave face and neck.  Do not bring valuables to the hospital.  [facility] is not responsible for any belongings or valuables.  Contacts, dentures or bridgework may not be worn into surgery.  Leave your suitcase in the car.  After surgery it may be brought to your room.  For patients admitted to the hospital, discharge time will be determined by your treatment team.  Patients discharged the day of surgery will not be allowed to drive home.   Special instructions: [facility]- Preparing For Surgery  Please read over the following fact sheets that you were given: [facility]- Preparing For [facility] and Patient Instructions for Mupirocin Application, Coughing and Deep Breathing, Pain Booklet

## 2016-01-09 NOTE — Progress Notes (Signed)
Mr Mayford Knifeurner reports that he has a history of HTN, but is not on Medication. Mr Mayford Knifeurner also has diabetes is suppose to be on Metformin but is out of medication due to lapse of Insurance. CBG this am is 178, patient does not check CBG.  Patient has not been seen by PCP "in a while", Patient was seen at [facility].  I spoke with the diabetic coordinator that is on call and told her of patient;'s history and she instructed me to tell patient to only drink sugar free beverages, to eat dinner with meat and vegetables tonight and to see PCP as soon as possible. Patient reported that insurance has been reinstated and that he would make an appointment.    I informed Edmonia CaprioAllison Z, PA of information and current EKG.

## 2016-01-10 ENCOUNTER — Encounter (HOSPITAL_COMMUNITY): Payer: Self-pay | Admitting: *Deleted

## 2016-01-10 ENCOUNTER — Ambulatory Visit (HOSPITAL_COMMUNITY): Payer: Medicaid Other | Admitting: Vascular Surgery

## 2016-01-10 ENCOUNTER — Ambulatory Visit (HOSPITAL_COMMUNITY): Payer: Medicaid Other

## 2016-01-10 ENCOUNTER — Encounter (HOSPITAL_COMMUNITY)
Admission: RE | Disposition: A | Discharge status: Home / Self Care | Payer: Self-pay | Source: Ambulatory Visit | Attending: Neurosurgery

## 2016-01-10 ENCOUNTER — Observation Stay (HOSPITAL_COMMUNITY)
Admission: RE | Admit: 2016-01-10 | Discharge: 2016-01-10 | Disposition: A | Discharge status: Home / Self Care | Payer: Medicaid Other | Source: Ambulatory Visit | Attending: Neurosurgery | Admitting: Neurosurgery

## 2016-01-10 ENCOUNTER — Ambulatory Visit (HOSPITAL_COMMUNITY): Payer: Medicaid Other | Admitting: Anesthesiology

## 2016-01-10 DIAGNOSIS — M5116 Intervertebral disc disorders with radiculopathy, lumbar region: Secondary | ICD-10-CM | POA: Diagnosis present

## 2016-01-10 DIAGNOSIS — M5126 Other intervertebral disc displacement, lumbar region: Secondary | ICD-10-CM | POA: Diagnosis present

## 2016-01-10 DIAGNOSIS — E119 Type 2 diabetes mellitus without complications: Secondary | ICD-10-CM | POA: Diagnosis not present

## 2016-01-10 DIAGNOSIS — I10 Essential (primary) hypertension: Secondary | ICD-10-CM | POA: Insufficient documentation

## 2016-01-10 DIAGNOSIS — Z79899 Other long term (current) drug therapy: Secondary | ICD-10-CM | POA: Insufficient documentation

## 2016-01-10 DIAGNOSIS — Z419 Encounter for procedure for purposes other than remedying health state, unspecified: Secondary | ICD-10-CM

## 2016-01-10 HISTORY — PX: LUMBAR LAMINECTOMY/DECOMPRESSION MICRODISCECTOMY: SHX5026

## 2016-01-10 LAB — GLUCOSE, CAPILLARY
Glucose-Capillary: 181 mg/dL — ABNORMAL HIGH (ref 65–99)
Glucose-Capillary: 173 mg/dL — ABNORMAL HIGH (ref 65–99)

## 2016-01-10 LAB — HEMOGLOBIN A1C
Hgb A1c MFr Bld: 6.9 % — ABNORMAL HIGH (ref 4.8–5.6)
MEAN PLASMA GLUCOSE: 151 mg/dL

## 2016-01-10 SURGERY — LUMBAR LAMINECTOMY/DECOMPRESSION MICRODISCECTOMY 1 LEVEL
Anesthesia: General | Site: Back | Laterality: Right

## 2016-01-10 MED ORDER — DEXTROSE 5 % IV SOLN
INTRAVENOUS | Status: DC | PRN
  Administered 2016-01-10: 3 g via INTRAVENOUS

## 2016-01-10 MED ORDER — LABETALOL HCL 5 MG/ML IV SOLN
5.0000 mg | INTRAVENOUS | Status: AC | PRN
  Administered 2016-01-10 (×2): 5 mg via INTRAVENOUS

## 2016-01-10 MED ORDER — MEPERIDINE HCL 25 MG/ML IJ SOLN: 6.2500 mg | INTRAMUSCULAR | Status: DC | PRN

## 2016-01-10 MED ORDER — FENTANYL CITRATE (PF) 100 MCG/2ML IJ SOLN
INTRAMUSCULAR | Status: DC | PRN
  Administered 2016-01-10: 200 ug via INTRAVENOUS
  Administered 2016-01-10: 100 ug via INTRAVENOUS

## 2016-01-10 MED ORDER — THROMBIN 5000 UNITS EX SOLR
CUTANEOUS | Status: DC | PRN
  Administered 2016-01-10: 11:00:00 via TOPICAL

## 2016-01-10 MED ORDER — PANTOPRAZOLE SODIUM 40 MG IV SOLR: 40.0000 mg | Freq: Every day | INTRAVENOUS | Status: DC

## 2016-01-10 MED ORDER — SODIUM CHLORIDE 0.9 % IV SOLN: INTRAVENOUS | Status: DC

## 2016-01-10 MED ORDER — MENTHOL 3 MG MT LOZG: 1.0000 | LOZENGE | OROMUCOSAL | Status: DC | PRN

## 2016-01-10 MED ORDER — EPHEDRINE SULFATE 50 MG/ML IJ SOLN
INTRAMUSCULAR | Status: DC | PRN
  Administered 2016-01-10: 10 mg via INTRAVENOUS

## 2016-01-10 MED ORDER — HEMOSTATIC AGENTS (NO CHARGE) OPTIME
TOPICAL | Status: DC | PRN
  Administered 2016-01-10: 1 via TOPICAL

## 2016-01-10 MED ORDER — EPHEDRINE 5 MG/ML INJ
INTRAVENOUS | Status: AC
  Filled 2016-01-10: qty 10

## 2016-01-10 MED ORDER — HYDROMORPHONE HCL 1 MG/ML IJ SOLN
0.2500 mg | INTRAMUSCULAR | Status: DC | PRN
  Administered 2016-01-10: 0.5 mg via INTRAVENOUS

## 2016-01-10 MED ORDER — 0.9 % SODIUM CHLORIDE (POUR BTL) OPTIME
TOPICAL | Status: DC | PRN
  Administered 2016-01-10: 1000 mL

## 2016-01-10 MED ORDER — SODIUM CHLORIDE 0.9 % IR SOLN
Status: DC | PRN
  Administered 2016-01-10: 11:00:00

## 2016-01-10 MED ORDER — PROPOFOL 10 MG/ML IV BOLUS
INTRAVENOUS | Status: DC | PRN
  Administered 2016-01-10: 200 mg via INTRAVENOUS

## 2016-01-10 MED ORDER — SENNA 8.6 MG PO TABS: 1.0000 | ORAL_TABLET | Freq: Two times a day (BID) | ORAL | Status: DC

## 2016-01-10 MED ORDER — METHOCARBAMOL 500 MG PO TABS: 500.0000 mg | ORAL_TABLET | Freq: Four times a day (QID) | ORAL | Status: DC | PRN

## 2016-01-10 MED ORDER — ROCURONIUM BROMIDE 10 MG/ML (PF) SYRINGE
PREFILLED_SYRINGE | INTRAVENOUS | Status: AC
Start: 2016-01-10 — End: 2016-01-10
  Filled 2016-01-10: qty 10

## 2016-01-10 MED ORDER — BUPIVACAINE HCL (PF) 0.5 % IJ SOLN
INTRAMUSCULAR | Status: DC | PRN
  Administered 2016-01-10: 5 mL

## 2016-01-10 MED ORDER — PHENOL 1.4 % MT LIQD: 1.0000 | OROMUCOSAL | Status: DC | PRN

## 2016-01-10 MED ORDER — METHYLPREDNISOLONE ACETATE 80 MG/ML IJ SUSP
INTRAMUSCULAR | Status: DC | PRN
  Administered 2016-01-10: 80 mg

## 2016-01-10 MED ORDER — LIDOCAINE-EPINEPHRINE 1 %-1:100000 IJ SOLN
INTRAMUSCULAR | Status: AC
  Filled 2016-01-10: qty 1

## 2016-01-10 MED ORDER — FENTANYL CITRATE (PF) 100 MCG/2ML IJ SOLN
INTRAMUSCULAR | Status: AC
  Filled 2016-01-10: qty 2

## 2016-01-10 MED ORDER — BISACODYL 10 MG RE SUPP: 10.0000 mg | Freq: Every day | RECTAL | Status: DC | PRN

## 2016-01-10 MED ORDER — CEFAZOLIN SODIUM-DEXTROSE 2-4 GM/100ML-% IV SOLN
2.0000 g | INTRAVENOUS | Status: DC
  Filled 2016-01-10: qty 100

## 2016-01-10 MED ORDER — LABETALOL HCL 5 MG/ML IV SOLN
INTRAVENOUS | Status: AC
  Administered 2016-01-10: 5 mg via INTRAVENOUS
  Filled 2016-01-10: qty 4

## 2016-01-10 MED ORDER — METHYLPREDNISOLONE ACETATE 80 MG/ML IJ SUSP
INTRAMUSCULAR | Status: AC
  Filled 2016-01-10: qty 1

## 2016-01-10 MED ORDER — ESMOLOL HCL 100 MG/10ML IV SOLN
INTRAVENOUS | Status: AC
  Filled 2016-01-10: qty 10

## 2016-01-10 MED ORDER — THROMBIN 5000 UNITS EX SOLR
CUTANEOUS | Status: DC | PRN
  Administered 2016-01-10 (×2): 5000 [IU] via TOPICAL

## 2016-01-10 MED ORDER — LIDOCAINE-EPINEPHRINE 1 %-1:100000 IJ SOLN
INTRAMUSCULAR | Status: DC | PRN
  Administered 2016-01-10: 5 mL

## 2016-01-10 MED ORDER — PROPOFOL 10 MG/ML IV BOLUS
INTRAVENOUS | Status: AC
  Filled 2016-01-10: qty 40

## 2016-01-10 MED ORDER — METHOCARBAMOL 1000 MG/10ML IJ SOLN
500.0000 mg | Freq: Four times a day (QID) | INTRAVENOUS | Status: DC | PRN
  Filled 2016-01-10: qty 5

## 2016-01-10 MED ORDER — ONDANSETRON HCL 4 MG/2ML IJ SOLN: 4.0000 mg | INTRAMUSCULAR | Status: DC | PRN

## 2016-01-10 MED ORDER — MIDAZOLAM HCL 2 MG/2ML IJ SOLN
INTRAMUSCULAR | Status: AC
  Filled 2016-01-10: qty 2

## 2016-01-10 MED ORDER — SUGAMMADEX SODIUM 500 MG/5ML IV SOLN
INTRAVENOUS | Status: AC
  Filled 2016-01-10: qty 5

## 2016-01-10 MED ORDER — PROMETHAZINE HCL 25 MG/ML IJ SOLN: 6.2500 mg | INTRAMUSCULAR | Status: DC | PRN

## 2016-01-10 MED ORDER — LIDOCAINE HCL (CARDIAC) 20 MG/ML IV SOLN
INTRAVENOUS | Status: DC | PRN
  Administered 2016-01-10: 50 mg via INTRAVENOUS

## 2016-01-10 MED ORDER — CHLORHEXIDINE GLUCONATE CLOTH 2 % EX PADS: 6.0000 | MEDICATED_PAD | Freq: Once | CUTANEOUS | Status: DC

## 2016-01-10 MED ORDER — LIDOCAINE 2% (20 MG/ML) 5 ML SYRINGE
INTRAMUSCULAR | Status: AC
Start: 2016-01-10 — End: 2016-01-10
  Filled 2016-01-10: qty 5

## 2016-01-10 MED ORDER — ACETAMINOPHEN 325 MG PO TABS: 650.0000 mg | ORAL_TABLET | ORAL | Status: DC | PRN

## 2016-01-10 MED ORDER — MIDAZOLAM HCL 2 MG/2ML IJ SOLN
INTRAMUSCULAR | Status: DC | PRN
  Administered 2016-01-10: 2 mg via INTRAVENOUS

## 2016-01-10 MED ORDER — GABAPENTIN 300 MG PO CAPS: 300.0000 mg | ORAL_CAPSULE | Freq: Three times a day (TID) | ORAL | Status: DC

## 2016-01-10 MED ORDER — ONDANSETRON HCL 4 MG/2ML IJ SOLN
INTRAMUSCULAR | Status: AC
  Filled 2016-01-10: qty 2

## 2016-01-10 MED ORDER — FENTANYL CITRATE (PF) 100 MCG/2ML IJ SOLN
INTRAMUSCULAR | Status: AC
  Filled 2016-01-10: qty 4

## 2016-01-10 MED ORDER — KETOROLAC TROMETHAMINE 30 MG/ML IJ SOLN
30.0000 mg | Freq: Once | INTRAMUSCULAR | Status: AC
  Administered 2016-01-10: 30 mg via INTRAVENOUS

## 2016-01-10 MED ORDER — SODIUM CHLORIDE 0.9 % IV SOLN: 250.0000 mL | INTRAVENOUS | Status: DC

## 2016-01-10 MED ORDER — HYDROMORPHONE HCL 2 MG/ML IJ SOLN
INTRAMUSCULAR | Status: AC
  Filled 2016-01-10: qty 1

## 2016-01-10 MED ORDER — SUGAMMADEX SODIUM 200 MG/2ML IV SOLN
INTRAVENOUS | Status: DC | PRN
  Administered 2016-01-10: 226.8 mg via INTRAVENOUS

## 2016-01-10 MED ORDER — OXYCODONE-ACETAMINOPHEN 5-325 MG PO TABS
1.0000 | ORAL_TABLET | ORAL | Status: DC | PRN
  Administered 2016-01-10: 2 via ORAL
  Filled 2016-01-10: qty 2

## 2016-01-10 MED ORDER — ROCURONIUM BROMIDE 100 MG/10ML IV SOLN
INTRAVENOUS | Status: DC | PRN
  Administered 2016-01-10: 80 mg via INTRAVENOUS

## 2016-01-10 MED ORDER — SODIUM CHLORIDE 0.9% FLUSH: 3.0000 mL | Freq: Two times a day (BID) | INTRAVENOUS | Status: DC

## 2016-01-10 MED ORDER — DOCUSATE SODIUM 100 MG PO CAPS: 100.0000 mg | ORAL_CAPSULE | Freq: Two times a day (BID) | ORAL | Status: DC

## 2016-01-10 MED ORDER — ONDANSETRON HCL 4 MG/2ML IJ SOLN
INTRAMUSCULAR | Status: DC | PRN
  Administered 2016-01-10: 4 mg via INTRAVENOUS

## 2016-01-10 MED ORDER — CEFAZOLIN SODIUM-DEXTROSE 2-4 GM/100ML-% IV SOLN: 2.0000 g | Freq: Three times a day (TID) | INTRAVENOUS | Status: DC

## 2016-01-10 MED ORDER — LACTATED RINGERS IV SOLN
INTRAVENOUS | Status: DC
  Administered 2016-01-10: 07:00:00 via INTRAVENOUS

## 2016-01-10 MED ORDER — LACTATED RINGERS IV SOLN
INTRAVENOUS | Status: DC | PRN
  Administered 2016-01-10 (×2): via INTRAVENOUS

## 2016-01-10 MED ORDER — MORPHINE SULFATE (PF) 4 MG/ML IV SOLN: 1.0000 mg | INTRAVENOUS | Status: DC | PRN

## 2016-01-10 MED ORDER — ACETAMINOPHEN 650 MG RE SUPP: 650.0000 mg | RECTAL | Status: DC | PRN

## 2016-01-10 MED ORDER — THROMBIN 5000 UNITS EX SOLR
CUTANEOUS | Status: AC
  Filled 2016-01-10: qty 15000

## 2016-01-10 MED ORDER — SODIUM CHLORIDE 0.9% FLUSH: 3.0000 mL | INTRAVENOUS | Status: DC | PRN

## 2016-01-10 MED ORDER — OXYCODONE-ACETAMINOPHEN 5-325 MG PO TABS: 1.0000 | ORAL_TABLET | Freq: Four times a day (QID) | ORAL | 0 refills | Status: AC | PRN

## 2016-01-10 MED ORDER — KETOROLAC TROMETHAMINE 30 MG/ML IJ SOLN
INTRAMUSCULAR | Status: AC
  Filled 2016-01-10: qty 1

## 2016-01-10 SURGICAL SUPPLY — 61 items
BAG DECANTER FOR FLEXI CONT (MISCELLANEOUS) ×3 IMPLANT
BENZOIN TINCTURE PRP APPL 2/3 (GAUZE/BANDAGES/DRESSINGS) IMPLANT
BLADE CLIPPER SURG (BLADE) ×3 IMPLANT
BLADE SURG 11 STRL SS (BLADE) ×3 IMPLANT
BUR MATCHSTICK NEURO 3.0 LAGG (BURR) ×3 IMPLANT
CANISTER SUCT 3000ML PPV (MISCELLANEOUS) ×3 IMPLANT
CARTRIDGE OIL MAESTRO DRILL (MISCELLANEOUS) ×1 IMPLANT
CLOSURE WOUND 1/2 X4 (GAUZE/BANDAGES/DRESSINGS)
DECANTER SPIKE VIAL GLASS SM (MISCELLANEOUS) ×3 IMPLANT
DERMABOND ADVANCED (GAUZE/BANDAGES/DRESSINGS) ×2
DERMABOND ADVANCED .7 DNX12 (GAUZE/BANDAGES/DRESSINGS) ×1 IMPLANT
DIFFUSER DRILL AIR PNEUMATIC (MISCELLANEOUS) ×3 IMPLANT
DRAPE LAPAROTOMY 100X72X124 (DRAPES) ×3 IMPLANT
DRAPE MICROSCOPE LEICA (MISCELLANEOUS) ×3 IMPLANT
DRAPE POUCH INSTRU U-SHP 10X18 (DRAPES) ×3 IMPLANT
DRAPE SURG 17X23 STRL (DRAPES) ×3 IMPLANT
DRSG OPSITE POSTOP 3X4 (GAUZE/BANDAGES/DRESSINGS) ×3 IMPLANT
DURAPREP 26ML APPLICATOR (WOUND CARE) ×3 IMPLANT
ELECT REM PT RETURN 9FT ADLT (ELECTROSURGICAL) ×3
ELECTRODE REM PT RTRN 9FT ADLT (ELECTROSURGICAL) ×1 IMPLANT
GAUZE SPONGE 4X4 12PLY STRL (GAUZE/BANDAGES/DRESSINGS) IMPLANT
GAUZE SPONGE 4X4 16PLY XRAY LF (GAUZE/BANDAGES/DRESSINGS) IMPLANT
GLOVE BIOGEL PI IND STRL 7.5 (GLOVE) ×3 IMPLANT
GLOVE BIOGEL PI IND STRL 8 (GLOVE) ×2 IMPLANT
GLOVE BIOGEL PI INDICATOR 7.5 (GLOVE) ×6
GLOVE BIOGEL PI INDICATOR 8 (GLOVE) ×4
GLOVE ECLIPSE 7.0 STRL STRAW (GLOVE) ×3 IMPLANT
GLOVE ECLIPSE 7.5 STRL STRAW (GLOVE) ×6 IMPLANT
GLOVE EXAM NITRILE LRG STRL (GLOVE) IMPLANT
GLOVE EXAM NITRILE XL STR (GLOVE) IMPLANT
GLOVE EXAM NITRILE XS STR PU (GLOVE) IMPLANT
GLOVE SS BIOGEL STRL SZ 7 (GLOVE) ×1 IMPLANT
GLOVE SUPERSENSE BIOGEL SZ 7 (GLOVE) ×2
GLOVE SURG SS PI 7.0 STRL IVOR (GLOVE) ×9 IMPLANT
GOWN STRL REUS W/ TWL LRG LVL3 (GOWN DISPOSABLE) ×3 IMPLANT
GOWN STRL REUS W/ TWL XL LVL3 (GOWN DISPOSABLE) IMPLANT
GOWN STRL REUS W/TWL 2XL LVL3 (GOWN DISPOSABLE) ×3 IMPLANT
GOWN STRL REUS W/TWL LRG LVL3 (GOWN DISPOSABLE) ×6
GOWN STRL REUS W/TWL XL LVL3 (GOWN DISPOSABLE)
HEMOSTAT POWDER KIT SURGIFOAM (HEMOSTASIS) ×3 IMPLANT
KIT BASIN OR (CUSTOM PROCEDURE TRAY) ×3 IMPLANT
KIT ROOM TURNOVER OR (KITS) ×3 IMPLANT
NEEDLE HYPO 18GX1.5 BLUNT FILL (NEEDLE) IMPLANT
NEEDLE HYPO 25X1 1.5 SAFETY (NEEDLE) ×3 IMPLANT
NEEDLE SPNL 18GX3.5 QUINCKE PK (NEEDLE) IMPLANT
NS IRRIG 1000ML POUR BTL (IV SOLUTION) ×3 IMPLANT
OIL CARTRIDGE MAESTRO DRILL (MISCELLANEOUS) ×3
PACK LAMINECTOMY NEURO (CUSTOM PROCEDURE TRAY) ×3 IMPLANT
PAD ARMBOARD 7.5X6 YLW CONV (MISCELLANEOUS) ×9 IMPLANT
RUBBERBAND STERILE (MISCELLANEOUS) ×6 IMPLANT
SPONGE LAP 4X18 X RAY DECT (DISPOSABLE) IMPLANT
SPONGE SURGIFOAM ABS GEL SZ50 (HEMOSTASIS) ×3 IMPLANT
STRIP CLOSURE SKIN 1/2X4 (GAUZE/BANDAGES/DRESSINGS) IMPLANT
SUT VIC AB 0 CT1 18XCR BRD8 (SUTURE) ×1 IMPLANT
SUT VIC AB 0 CT1 8-18 (SUTURE) ×2
SUT VIC AB 2-0 CT1 18 (SUTURE) IMPLANT
SUT VICRYL 3-0 RB1 18 ABS (SUTURE) ×6 IMPLANT
SYR 3ML LL SCALE MARK (SYRINGE) IMPLANT
TOWEL OR 17X24 6PK STRL BLUE (TOWEL DISPOSABLE) ×3 IMPLANT
TOWEL OR 17X26 10 PK STRL BLUE (TOWEL DISPOSABLE) ×3 IMPLANT
WATER STERILE IRR 1000ML POUR (IV SOLUTION) ×3 IMPLANT

## 2016-01-10 NOTE — Transfer of Care (Signed)
Immediate Anesthesia Transfer of Care Note  Patient: Andres Collins  Procedure(s) Performed: Procedure(s): MICRODISCECTOMY RIGHT LUMBAR FOUR-FIVE (Right)  Patient Location: PACU  Anesthesia Type:General  Level of Consciousness: awake, alert  and oriented  Airway & Oxygen Therapy: Patient Spontanous Breathing and Patient connected to nasal cannula oxygen  Post-op Assessment: Report given to RN and Post -op Vital signs reviewed and stable  Post vital signs: Reviewed and stable  Last Vitals:  Vitals:   01/10/16 0718 01/10/16 1130  BP: 137/86 (!) (P) 158/95  Pulse: 96 (P) 79  Resp: 20 (P) 15  Temp: 37.1 C (P) 36.3 C    Last Pain:  Vitals:   01/10/16 0718  TempSrc: Oral  PainSc:       Patients Stated Pain Goal: 2 (01/10/16 0716)  Complications: No apparent anesthesia complications

## 2016-01-10 NOTE — Anesthesia Procedure Notes (Signed)
Procedure Name: Intubation Date/Time: 01/10/2016 10:04 AM Performed by: Gwenyth AllegraADAMI, Corinna Burkman Pre-anesthesia Checklist: Patient identified, Emergency Drugs available, Suction available, Patient being monitored and Timeout performed Patient Re-evaluated:Patient Re-evaluated prior to inductionOxygen Delivery Method: Circle system utilized Preoxygenation: Pre-oxygenation with 100% oxygen Ventilation: Mask ventilation without difficulty and Oral airway inserted - appropriate to patient size Laryngoscope Size: Mac and 4 Grade View: Grade I Tube type: Oral Tube size: 8.0 mm Placement Confirmation: ETT inserted through vocal cords under direct vision,  CO2 detector and breath sounds checked- equal and bilateral Secured at: 24 cm Dental Injury: Teeth and Oropharynx as per pre-operative assessment

## 2016-01-10 NOTE — Anesthesia Postprocedure Evaluation (Signed)
Anesthesia Post Note  Patient: Nonah Mattesaron P Cranmer  Procedure(s) Performed: Procedure(s) (LRB): MICRODISCECTOMY RIGHT LUMBAR FOUR-FIVE (Right)  Patient location during evaluation: PACU Anesthesia Type: General Level of consciousness: awake and sedated Pain management: pain level controlled Vital Signs Assessment: post-procedure vital signs reviewed and stable Respiratory status: spontaneous breathing Cardiovascular status: stable Postop Assessment: no signs of nausea or vomiting Anesthetic complications: no     Last Vitals:  Vitals:   01/10/16 1317 01/10/16 1345  BP: (!) 132/91 (!) 151/107  Pulse: 73 62  Resp: 11 18  Temp:      Last Pain:  Vitals:   01/10/16 1317  TempSrc:   PainSc: 1    Pain Goal: Patients Stated Pain Goal: 2 (01/10/16 0716)               Waylin Dorko JR,JOHN Susann GivensFRANKLIN

## 2016-01-10 NOTE — Op Note (Signed)
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 Right L4 laminotomy and microdiscectomy for decompression of nerve root 2. Use of operating microscope  SURGEON: Dr. Lisbeth RenshawNeelesh Ghazi Rumpf, MD  ASSISTANT: Dr. Cherrie DistanceBenjamin Ditty, MD  ANESTHESIA: General Endotracheal  EBL: 100cc  SPECIMENS: None  DRAINS: None  COMPLICATIONS: None immediate  CONDITION: Stable to PACU  HISTORY: Andres Collins is a 35 y.o. male seen in the outpatient clinic with right leg pain consistent with a right L5 radiculopathy. His MRI did demonstrate a fairly broad-based right eccentric L4-5 disc herniation. He attempted multiple conservative treatments including epidural steroid injections which were unsuccessful. He therefore elected to proceed with surgical decompression. Risks and benefits of the surgery were explained in detail to the patient and his family. After all questions were answered, informed consent was obtained and witnessed.  PROCEDURE IN DETAIL: The patient was brought to the operating roomvia stretcher. After induction of general anesthesia, the patient was positioned on the operative table in the prone position with all pressure points meticulously padded. The skin of the low back was then prepped and draped in the usual sterile fashion.  Under x-ray, the correct level was identified and marked out on the skin, and after timeout was conducted, the skin was infiltrated with local anesthetic. Skin incision was then made sharply and Bovie electrocautery was used to dissect the subcutaneous tissue until the lumbodorsal fascia was identified. The fascia was then incised using Bovie electrocautery and the lamina at the L4 and L5 levels was identified and dissection was carried out in the subperiosteal plane. Self-retaining retractor was then placed, and intraoperative x-ray was taken to confirm we were at the correct level.  Using a high-speed drill, laminotomy was  completed with a partial medial facetectomy. The ligamentum flavum was then identified and removed and the lateral edge of the thecal sac was identified. This was then traced down to identify the traversing nerve root. The nerve root appeared to be displaced dorsally. A dissector was placed in the ventral epidural space to dissect the epidural tissue. Nerve root was then retracted medially, the posterior annulus was then incised, and using a combination of dissectors, curettes, and rongeurs, the herniated disc fragments was removed. The decompression of the nerve root was confirmed using a ball tip dissector.  Hemostasis was then secured using a combination of morcellized Gelfoam and thrombin and bipolar electrocautery. The wound is irrigated with copious amounts of antibiotic saline irrigation. The nerve root was then covered with a long-acting steroid solution. Self-retaining retractor was then removed, and the wound is closed in layers using a combination of interrupted 0 Vicryl and 3-0 Vicryl stitches. The skin was closed using standard skin glue.  At the end of the case all sponge, needle, and instrument counts were correct. The patient was then transferred to the stretcher and taken to the postanesthesia care unit in stable hemodynamic condition.

## 2016-01-10 NOTE — Progress Notes (Signed)
Pt doing well. Pt and wife given D/C instructions with Rx, verbal understanding was provided. Pt's IV was removed prior to D/C. Pt D/C'd home via wheelchair @ 1650 per MD order. Pt is stable @ D/C and has no other needs at this time. Rema FendtAshley Chelcie Estorga, RN

## 2016-01-10 NOTE — H&P (Signed)
CC:  Back and right-leg pain  HPI: [name] is a 35 year old male I'm seeing for back and right leg pain. He says he has had a long history of flareups of back and right-sided leg pain over the last 10 years. [place] used to live in [place], and says that he has previously undergone MRI of the lumbar spine which demonstrated herniated disks. He says usually, when he has a flareup of back and right leg pain, he is able to rest and take naproxen, and his symptoms resolved within 1-2 weeks. Unfortunately, this flareup has lasted now for several weeks. He describes pain which runs from the top of his buttock on the right side, down the back of his leg and through the calf. He says he has numbness and tingling of his right foot. He does not have any left leg symptoms. He has not noted any changes in bowel or bladder function. He has been taking naproxen, and was also given shots in the emergency department for the pain, but none of these seem to be helping. He has not been able to work because of the pain. We have sent him for ESI, and have tried neurontin with minimal relief.   PMH: Past Medical History:  Diagnosis Date  . Anxiety    with MRI- very anxious  . Depression    Situational  . Diabetes mellitus without complication (HCC)    Type II   . Hypertension    not on medication currently  . Sciatica     PSH: Past Surgical History:  Procedure Laterality Date  . NO PAST SURGERIES      SH: Social History  Substance Use Topics  . Smoking status: Never Smoker  . Smokeless tobacco: Never Used  . Alcohol use 3.6 oz/week    6 Shots of liquor per week    MEDS: Prior to Admission medications   Medication Sig Start Date End Date Taking? Authorizing Provider  gabapentin (NEURONTIN) 300 MG capsule Take 300 mg by mouth 3 (three) times daily. 11/04/15  Yes Historical Provider, MD  methocarbamol (ROBAXIN) 500 MG tablet Take 1 tablet (500 mg total) by mouth every 8 (eight) hours as needed for muscle  spasms. Patient taking differently: Take 500 mg by mouth every 8 (eight) hours.  10/28/15  Yes Mercedes Camprubi-Soms, PA-C  methocarbamol (ROBAXIN) 500 MG tablet Take 1 tablet (500 mg total) by mouth 2 (two) times daily. Patient not taking: Reported on 01/06/2016 10/17/15   Roxy Horsemanobert Browning, PA-C  naproxen (NAPROSYN) 500 MG tablet Take 1 tablet (500 mg total) by mouth 2 (two) times daily as needed for mild pain, moderate pain or headache (TAKE WITH MEALS.). Patient taking differently: Take 500 mg by mouth 2 (two) times daily.  10/28/15   Mercedes Camprubi-Soms, PA-C  oxyCODONE-acetaminophen (PERCOCET) 5-325 MG tablet Take 1 tablet by mouth every 6 (six) hours as needed for severe pain. Patient not taking: Reported on 01/06/2016 10/28/15   Mercedes Camprubi-Soms, PA-C  predniSONE (DELTASONE) 20 MG tablet Take 2 tablets (40 mg total) by mouth daily. Patient not taking: Reported on 01/06/2016 10/17/15   Roxy Horsemanobert Browning, PA-C    ALLERGY: Allergies  Allergen Reactions  . No Known Allergies     ROS: ROS  NEUROLOGIC EXAM: Awake, alert, oriented Memory and concentration grossly intact Speech fluent, appropriate CN grossly intact Motor exam: Upper Extremities Deltoid Bicep Tricep Grip  Right 5/5 5/5 5/5 5/5  Left 5/5 5/5 5/5 5/5   Lower Extremity IP Quad PF DF  EHL  Right 5/5 5/5 5/5 5/5 5/5  Left 5/5 5/5 5/5 5/5 5/5   Sensation grossly intact to LT  [facility]: MRI of the lumbar spine was reviewed. This demonstrates straightening of the normal sterile bar lordosis. At L4 L5, there is broad-based right eccentric disc bulge, with resultant right greater than left lateral recess stenosis, and likely compression of the traversing right L5 nerve root. In addition, at L5-S1, there is broad-based disc bulge, with left-sided disc osteophyte complex and resultant left lateral recess stenosis.  IMPRESSION: - 35 y.o. male with right L5 radiculopathy from L4-5 disc herniation who has failed conservative  treatment.  PLAN: - Proceed with right-sided L4 L5 laminotomy microdiscectomy    The radiographic findings and general treatment options were discussed in detail with the patient and family in the office. Given symtpoms despite conservative therapies, I offered the above surgical decompression. The risks of the surgery were discussed in detail, including but not limited to bleeding, infection, nerve injury resulting in leg/foot weakness or bowel/bladder dysfunction, and CSF leak. Possible outcomes of surgery were also discussed including the possibility of persistence or worsening of pain symptoms. The general risks of anesthesia were also reviewed including heart attack, stroke, and DVT/PE.   The patient understood our discussion and is willing to proceed with surgery. All questions were answered.

## 2016-01-10 NOTE — Anesthesia Preprocedure Evaluation (Signed)
Anesthesia Evaluation  Patient identified by MRN, date of birth, ID band Patient awake    Reviewed: Allergy & Precautions, H&P , NPO status , Patient's Chart, lab work & pertinent test results, reviewed documented beta blocker date and time   Airway Mallampati: I  TM Distance: >3 FB Neck ROM: full    Dental no notable dental hx. (+) Teeth Intact   Pulmonary neg pulmonary ROS,    Pulmonary exam normal breath sounds clear to auscultation       Cardiovascular hypertension, negative cardio ROS Normal cardiovascular exam     Neuro/Psych    GI/Hepatic negative GI ROS, Neg liver ROS,   Endo/Other  diabetes  Renal/GU negative Renal ROS     Musculoskeletal   Abdominal Normal abdominal exam  (+)   Peds  Hematology negative hematology ROS (+)   Anesthesia Other Findings   Reproductive/Obstetrics negative OB ROS                             Anesthesia Physical Anesthesia Plan  ASA: II  Anesthesia Plan: General   Post-op Pain Management:    Induction: Intravenous  Airway Management Planned: Oral ETT  Additional Equipment:   Intra-op Plan:   Post-operative Plan: Extubation in OR  Informed Consent: I have reviewed the patients History and Physical, chart, labs and discussed the procedure including the risks, benefits and alternatives for the proposed anesthesia with the patient or authorized representative who has indicated his/her understanding and acceptance.   Dental Advisory Given  Plan Discussed with: CRNA  Anesthesia Plan Comments:         Anesthesia Quick Evaluation

## 2016-01-10 NOTE — Discharge Summary (Signed)
  Physician Discharge Summary  Patient ID: Andres Collins MRN: [id number] DOB/AGE: 37/03/1980 37 y.o.  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 Andres Collins is a 35 y.o. male who presented to the clinic with right radiculopathy and MRI demonstrating right L4-5 disc herniation. The patient was admitted for elective L4-5 laminotomy and microdiscectomy which was done without complication. Postop, the patient was at neurologic baseline, reporting relief of right leg pain. Back pain was controlled with oral medication, he was ambulating without difficulty, voiding normally, and tolerating diet.  Treatments: Surgery - right L4-5 laminotomy, microdiscectomy  Discharge Exam: Blood pressure (!) 151/107, pulse 62, temperature 97.6 F (36.4 C), resp. rate 18, weight 113.4 kg (250 lb), SpO2 100 %. Awake, alert, oriented Speech fluent, appropriate CN grossly intact 5/5 BUE/BLE Wound c/d/i  Disposition: 01-Home or Self Care     Medication List    STOP taking these medications   gabapentin 300 MG capsule Commonly known as:  NEURONTIN     TAKE these medications   methocarbamol 500 MG tablet Commonly known as:  ROBAXIN Take 1 tablet (500 mg total) by mouth 2 (two) times daily. What changed:  Another medication with the same name was changed. Make sure you understand how and when to take each.   methocarbamol 500 MG tablet Commonly known as:  ROBAXIN Take 1 tablet (500 mg total) by mouth every 8 (eight) hours as needed for muscle spasms. What changed:  when to take this   naproxen 500 MG tablet Commonly known as:  NAPROSYN Take 1 tablet (500 mg total) by mouth 2 (two) times daily as needed for mild pain, moderate pain or headache (TAKE WITH MEALS.). What  changed:  when to take this   oxyCODONE-acetaminophen 5-325 MG tablet Commonly known as:  PERCOCET Take 1-2 tablets by mouth every 6 (six) hours as needed for severe pain. What changed:  how much to take   predniSONE 20 MG tablet Commonly known as:  DELTASONE Take 2 tablets (40 mg total) by mouth daily.      Follow-up Information    Sibley Rolison, C, MD Follow up in 2 week(s).   Specialty:  Neurosurgery Contact information: [address] [place] [address] [phone]           Signed: Jackelyn HoehnUNDKUMAR, Sophiea Ueda, C 01/10/2016, 4:28 PM

## 2016-01-10 NOTE — Progress Notes (Signed)
BP responded well to labetalol 10 mg. 131/89

## 2016-01-11 NOTE — Anesthesia Postprocedure Evaluation (Deleted)
Anesthesia Post Note  Patient: Andres Collins  Procedure(s) Performed: Procedure(s) (LRB): MICRODISCECTOMY RIGHT LUMBAR FOUR-FIVE (Right)  Patient location during evaluation: PACU Anesthesia Type: General Level of consciousness: awake and sedated Pain management: pain level controlled Vital Signs Assessment: post-procedure vital signs reviewed and stable Respiratory status: spontaneous breathing Cardiovascular status: stable Postop Assessment: no signs of nausea or vomiting Anesthetic complications: no     Last Vitals:  Vitals:   01/10/16 1317 01/10/16 1345  BP: (!) 132/91 (!) 151/107  Pulse: 73 62  Resp: 11 18  Temp:      Last Pain:  Vitals:   01/10/16 1630  TempSrc:   PainSc: 3    Pain Goal: Patients Stated Pain Goal: 2 (01/10/16 1545)               Jahmire Ruffins JR,JOHN Susann GivensFRANKLIN

## 2016-01-13 ENCOUNTER — Encounter (HOSPITAL_COMMUNITY): Payer: Self-pay | Admitting: Neurosurgery

## 2017-05-19 IMAGING — XA Imaging study
1 series · 1 of 1 positions shown · non-contrast
Comparison: none

CLINICAL DATA: Lumbosacral spondylosis without myelopathy.
Symptomatic RIGHT L4-5 disc protrusion/extrusion.

[Series 1: ortho standard · 1 of 1 slices shown]
[im 1/1]
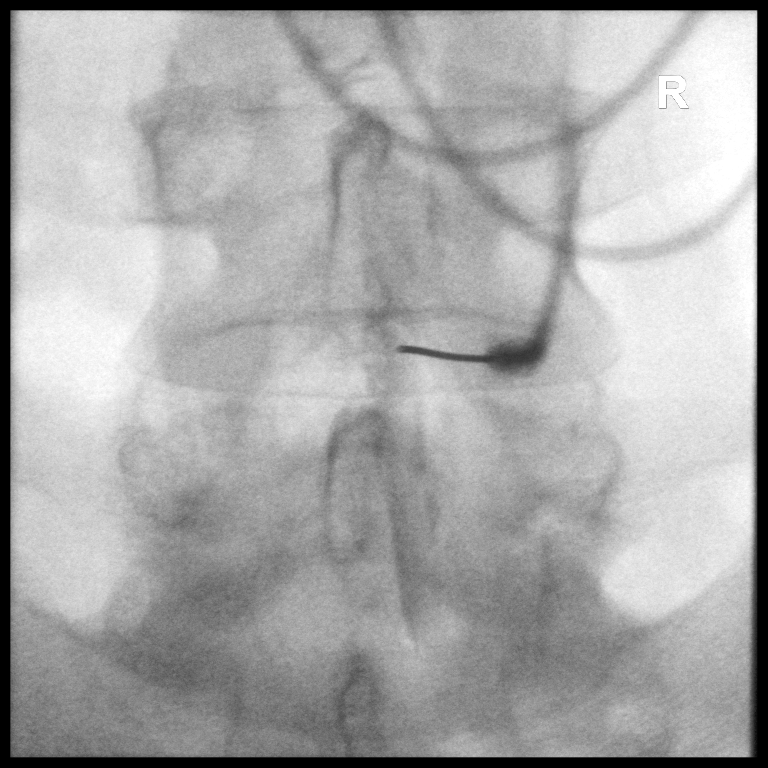

[1 of 1 positions shown; findings below may reference images not displayed]

FLUOROSCOPY TIME:  6 seconds corresponding to a Dose Area Product of
34.26 ?Gy*m2

PROCEDURE:
The procedure, risks, benefits, and alternatives were explained to
the patient. Questions regarding the procedure were encouraged and
answered. The patient understands and consents to the procedure.

LUMBAR EPIDURAL INJECTION:

An interlaminar approach was performed on RIGHT at L4-5. The
overlying skin was cleansed and anesthetized. A 20 gauge epidural
needle was advanced using loss-of-resistance technique.

DIAGNOSTIC EPIDURAL INJECTION:

Injection of Isovue-M 200 shows a good epidural pattern with spread
above and below the level of needle placement, primarily on the
RIGHT; no vascular opacification is seen.

THERAPEUTIC EPIDURAL INJECTION:

120.0 Mg of Depo-Medrol mixed with 2 mL 1% lidocaine were instilled.
The procedure was well-tolerated, and the patient was discharged
thirty minutes following the injection in good condition.

COMPLICATIONS:
None appear
IMPRESSION: Technically successful epidural injection on the RIGHT L4-5 #1.
